# Patient Record
Sex: Female | Born: 1990 | Race: Black or African American | Hispanic: No | Marital: Single | Smoking: Current some day smoker
Health system: Southern US, Community
[De-identification: ages and names within clinical notes are randomized; demographics above are authoritative.]

## PROBLEM LIST (undated history)

## (undated) ENCOUNTER — Inpatient Hospital Stay (HOSPITAL_COMMUNITY): Payer: Self-pay

## (undated) DIAGNOSIS — F419 Anxiety disorder, unspecified: Secondary | ICD-10-CM

---

## 2018-06-28 ENCOUNTER — Encounter (HOSPITAL_COMMUNITY): Payer: Self-pay | Admitting: Emergency Medicine

## 2018-06-28 ENCOUNTER — Emergency Department (HOSPITAL_COMMUNITY)
Admission: EM | Admit: 2018-06-28 | Discharge: 2018-06-28 | Disposition: A | Discharge status: Home / Self Care | Payer: Self-pay | Attending: Emergency Medicine | Admitting: Emergency Medicine

## 2018-06-28 DIAGNOSIS — B9789 Other viral agents as the cause of diseases classified elsewhere: Secondary | ICD-10-CM | POA: Insufficient documentation

## 2018-06-28 DIAGNOSIS — J029 Acute pharyngitis, unspecified: Secondary | ICD-10-CM | POA: Insufficient documentation

## 2018-06-28 HISTORY — DX: Anxiety disorder, unspecified: F41.9

## 2018-06-28 LAB — GROUP A STREP BY PCR: Group A Strep by PCR: NOT DETECTED

## 2018-06-28 MED ORDER — IBUPROFEN 400 MG PO TABS
600.0000 mg | ORAL_TABLET | Freq: Once | ORAL | Status: AC
  Administered 2018-06-28: 600 mg via ORAL
  Filled 2018-06-28: qty 1

## 2018-06-28 NOTE — Discharge Instructions (Addendum)
Your strep test is negative. Please read instructions below.  You can take tylenol or ibuprofen as needed for sore throat.  Drink plenty of water.  Use saline nasal spray for congestion. Establish primary care.  Return to the ER for inability to swallow liquids, difficulty breathing, or new or worsening symptoms.

## 2018-06-28 NOTE — ED Provider Notes (Signed)
[facility] EMERGENCY DEPARTMENT Provider Note   CSN: [id number] Arrival date & time: 06/28/18  0705     History   Chief Complaint Chief Complaint  Patient presents with  . Sore Throat  . URI    HPI Destiny Abbott is a 27 y.o. female past medical history of anxiety, presenting the emergency department with gradual onset of sore throat that began late last night. She states she had cold symptoms that began before Thanksgiving which had resolved with the exception of a mild lingering cough.  These new symptoms started last night while at work.  States her throat felt somewhat scratchy and therefore she tried treating with hot tea without much relief.  She states her throat is painful to yawn, talk, swallow.  She is associated rhinorrhea and bilateral ear pain.  No fever, difficulty breathing or swallowing.  The history is provided by the patient.    Past Medical History:  Diagnosis Date  . Anxiety     There are no active problems to display for this patient.   History reviewed. No pertinent surgical history.   OB History   None      Home Medications    Prior to Admission medications   Not on File    Family History No family history on file.  Social History Social History   Tobacco Use  . Smoking status: Not on file  Substance Use Topics  . Alcohol use: Not on file  . Drug use: Not on file     Allergies   Patient has no known allergies.   Review of Systems Review of Systems  Constitutional: Negative for fever.  HENT: Positive for ear pain, rhinorrhea, sore throat and voice change (Hoarse). Negative for ear discharge and trouble swallowing.   Respiratory: Negative for shortness of breath.      Physical Exam Updated Vital Signs BP 118/85   Pulse 72   Temp 98.7 F (37.1 C)   Resp 18   LMP 06/28/2018   SpO2 100%   Physical Exam  Constitutional: She appears well-developed and well-nourished.  Non-toxic appearance. She does not  appear ill.  HENT:  Head: Normocephalic and atraumatic.  Right Ear: Tympanic membrane and ear canal normal. There is tenderness.  Left Ear: Tympanic membrane and ear canal normal. There is tenderness.  Mouth/Throat: Uvula is midline. No trismus in the jaw. No uvula swelling. Posterior oropharyngeal erythema (very mild) present. No posterior oropharyngeal edema. No tonsillar exudate.  Eyes: Conjunctivae are normal.  Neck: Normal range of motion. Neck supple.  Cardiovascular: Normal rate, regular rhythm and normal heart sounds.  Pulmonary/Chest: Effort normal and breath sounds normal. No respiratory distress.  Lymphadenopathy:    She has no cervical adenopathy.  Psychiatric: She has a normal mood and affect. Her behavior is normal.  Nursing note and vitals reviewed.    ED Treatments / Results  Labs (all labs ordered are listed, but only abnormal results are displayed) Labs Reviewed  GROUP A STREP BY PCR    EKG None  Radiology No results found.  Procedures Procedures (including critical care time)  Medications Ordered in ED Medications  ibuprofen (ADVIL,MOTRIN) tablet 600 mg (600 mg Oral Given 06/28/18 0735)     Initial Impression / Assessment and Plan / ED Course  I have reviewed the triage vital signs and the nursing notes.  Pertinent labs & imaging results that were available during my care of the patient were reviewed by me and considered in my medical  decision making (see chart for details).     Patients symptoms are consistent with pharyngitis, likely viral etiology. Strep test neg. Patient is afebrile, tolerating secretions. Presentation not concerning for PTA or infxn spread to soft tissue. No difficulty breathing or swallowing. No trismus or uvula deviation. Discussed that antibiotics are not indicated for viral infections. Pt will be discharged with symptomatic treatment. Strict return precautions given. Pt is hemodynamically stable and in NAD prior to  discharge.  Discussed results, findings, treatment and follow up. Patient advised of return precautions. Patient verbalized understanding and agreed with plan.  Final Clinical Impressions(s) / ED Diagnoses   Final diagnoses:  Viral pharyngitis    ED Discharge Orders    None       Green Quincy, [place] N, PA-C 06/28/18 [id number]    Pricilla LovelessGoldston, Scott, MD 07/02/18 1511

## 2018-06-28 NOTE — ED Triage Notes (Signed)
Pt here fort eval of 1-2 weeks of sore throat, bilateral ear pain, runny nose congestion and a cough.

## 2018-06-28 NOTE — ED Notes (Signed)
Declined W/C at D/C and was escorted to lobby by RN. 

## 2018-10-01 ENCOUNTER — Inpatient Hospital Stay (HOSPITAL_COMMUNITY): Payer: Medicaid Other

## 2018-10-01 ENCOUNTER — Other Ambulatory Visit: Payer: Self-pay

## 2018-10-01 ENCOUNTER — Encounter (HOSPITAL_COMMUNITY): Payer: Self-pay | Admitting: *Deleted

## 2018-10-01 ENCOUNTER — Inpatient Hospital Stay (HOSPITAL_COMMUNITY)
Admission: AD | Admit: 2018-10-01 | Discharge: 2018-10-01 | Disposition: A | Discharge status: Home / Self Care | Payer: Medicaid Other | Attending: Obstetrics and Gynecology | Admitting: Obstetrics and Gynecology

## 2018-10-01 DIAGNOSIS — O99341 Other mental disorders complicating pregnancy, first trimester: Secondary | ICD-10-CM | POA: Diagnosis not present

## 2018-10-01 DIAGNOSIS — F419 Anxiety disorder, unspecified: Secondary | ICD-10-CM | POA: Diagnosis not present

## 2018-10-01 DIAGNOSIS — O200 Threatened abortion: Secondary | ICD-10-CM | POA: Insufficient documentation

## 2018-10-01 DIAGNOSIS — N939 Abnormal uterine and vaginal bleeding, unspecified: Secondary | ICD-10-CM

## 2018-10-01 DIAGNOSIS — F17200 Nicotine dependence, unspecified, uncomplicated: Secondary | ICD-10-CM | POA: Diagnosis not present

## 2018-10-01 DIAGNOSIS — Z3A09 9 weeks gestation of pregnancy: Secondary | ICD-10-CM | POA: Diagnosis not present

## 2018-10-01 DIAGNOSIS — O99331 Smoking (tobacco) complicating pregnancy, first trimester: Secondary | ICD-10-CM | POA: Insufficient documentation

## 2018-10-01 DIAGNOSIS — O09291 Supervision of pregnancy with other poor reproductive or obstetric history, first trimester: Secondary | ICD-10-CM

## 2018-10-01 LAB — WET PREP, GENITAL
CLUE CELLS WET PREP: NONE SEEN
Sperm: NONE SEEN
TRICH WET PREP: NONE SEEN

## 2018-10-01 LAB — CBC WITH DIFFERENTIAL/PLATELET
Abs Immature Granulocytes: 0.05 10*3/uL (ref 0.00–0.07)
Basophils Absolute: 0 10*3/uL (ref 0.0–0.1)
Basophils Relative: 0 %
Eosinophils Absolute: 0.3 10*3/uL (ref 0.0–0.5)
Eosinophils Relative: 3 %
HCT: 33.4 % — ABNORMAL LOW (ref 36.0–46.0)
HEMOGLOBIN: 10.8 g/dL — AB (ref 12.0–15.0)
Immature Granulocytes: 1 %
LYMPHS ABS: 2.7 10*3/uL (ref 0.7–4.0)
Lymphocytes Relative: 28 %
MCH: 29 pg (ref 26.0–34.0)
MCHC: 32.3 g/dL (ref 30.0–36.0)
MCV: 89.8 fL (ref 80.0–100.0)
Monocytes Absolute: 0.9 10*3/uL (ref 0.1–1.0)
Monocytes Relative: 9 %
Neutro Abs: 5.5 10*3/uL (ref 1.7–7.7)
Neutrophils Relative %: 59 %
Platelets: 305 10*3/uL (ref 150–400)
RBC: 3.72 MIL/uL — AB (ref 3.87–5.11)
RDW: 13.8 % (ref 11.5–15.5)
WBC: 9.5 10*3/uL (ref 4.0–10.5)
nRBC: 0 % (ref 0.0–0.2)

## 2018-10-01 LAB — URINALYSIS, ROUTINE W REFLEX MICROSCOPIC
BILIRUBIN URINE: NEGATIVE
Glucose, UA: NEGATIVE mg/dL
Hgb urine dipstick: NEGATIVE
Ketones, ur: NEGATIVE mg/dL
Leukocytes,Ua: NEGATIVE
Nitrite: NEGATIVE
Protein, ur: NEGATIVE mg/dL
Specific Gravity, Urine: 1.024 (ref 1.005–1.030)
pH: 6 (ref 5.0–8.0)

## 2018-10-01 LAB — COMPREHENSIVE METABOLIC PANEL
ALBUMIN: 3.5 g/dL (ref 3.5–5.0)
ALT: 16 U/L (ref 0–44)
AST: 20 U/L (ref 15–41)
Alkaline Phosphatase: 69 U/L (ref 38–126)
Anion gap: 7 (ref 5–15)
BILIRUBIN TOTAL: 0.4 mg/dL (ref 0.3–1.2)
BUN: 6 mg/dL (ref 6–20)
CO2: 23 mmol/L (ref 22–32)
Calcium: 9.5 mg/dL (ref 8.9–10.3)
Chloride: 104 mmol/L (ref 98–111)
Creatinine, Ser: 0.67 mg/dL (ref 0.44–1.00)
GFR calc Af Amer: >60 mL/min (ref >60)
GFR calc non Af Amer: >60 mL/min (ref >60)
GLUCOSE: 87 mg/dL (ref 70–99)
Potassium: 3.5 mmol/L (ref 3.5–5.1)
SODIUM: 134 mmol/L — AB (ref 135–145)
Total Protein: 7 g/dL (ref 6.5–8.1)

## 2018-10-01 LAB — HCG, QUANTITATIVE, PREGNANCY: hCG, Beta Chain, Quant, S: 90936 m[IU]/mL — ABNORMAL HIGH (ref <5)

## 2018-10-01 MED ORDER — CONCEPT OB 130-92.4-1 MG PO CAPS: 1.0000 | ORAL_CAPSULE | Freq: Every day | ORAL | 2 refills | Status: AC

## 2018-10-01 NOTE — Discharge Instructions (Signed)
Threatened Miscarriage    A threatened miscarriage is when you have bleeding from your vagina during the first 20 weeks of pregnancy but the pregnancy does not end. Your doctor will do tests to make sure you are still pregnant. The cause of the bleeding may not be known. This condition does not mean your pregnancy will end, but it does increase the risk that it will end (complete miscarriage).  Follow these instructions at home:  · Get plenty of rest.  · If you have bleeding in your vagina, do not have sex or use tampons.  · Do not douche.  · Do not smoke or use drugs.  · Do not drink alcohol.  · Avoid caffeine.  · Keep all follow-up prenatal visits as told by your doctor. This is important.  Contact a doctor if:  · You have light bleeding from your vagina.  · You have belly pain or cramping.  · You have a fever.  Get help right away if:  · You have heavy bleeding from your vagina.  · You have clots of blood coming from your vagina.  · You pass tissue from your vagina.  · You have a gush of fluid from your vagina.  · You are leaking fluid from your vagina.  · You have very bad pain or cramps in your low back or belly.  · You have fever, chills, and very bad belly pain.  Summary  · A threatened miscarriage is when you have bleeding from your vagina during the first 20 weeks of pregnancy but the pregnancy does not end.  · This condition does not mean your pregnancy will end, but it does increase the risk that it will end (complete miscarriage).  · Get plenty of rest. If you have bleeding in your vagina, do not have sex or use tampons.  · Keep all follow-up prenatal visits as told by your doctor. This is important.  This information is not intended to replace advice given to you by your health care provider. Make sure you discuss any questions you have with your health care provider.  Document Released: 06/22/2008 Document Revised: 10/06/2016 Document Reviewed: 10/06/2016  Elsevier Interactive Patient Education © 2019  [employer]

## 2018-10-01 NOTE — MAU Provider Note (Addendum)
History     CSN: [id number]  Arrival date and time: 10/01/18 1514   First Provider Initiated Contact with Patient 10/01/18 1623      Chief Complaint  Patient presents with  . Abdominal Pain  . Vaginal Bleeding   HPI Destiny Abbott is a 28 y.o. G3P0020 at [redacted]w[redacted]d who presents to MAU with chief complaint of sharp abdominal pain and heavy vaginal bleeding. These are new problems, onset at work this morning. Patient reports she has had episodes of vaginal spotting and was told she had a "threatened miscarriage". However, after lunch she experienced a "slice of pain across my abdomen" closely followed by multiple episodes of heavy vaginal bleeding. Her symptoms resolved without intervention prior to her arrival in MAU.  OB History    Gravida  3   Para      Term      Preterm      AB  2   Living        SAB  2   TAB      Ectopic      Multiple      Live Births              Past Medical History:  Diagnosis Date  . Anxiety     History reviewed. No pertinent surgical history.  History reviewed. No pertinent family history.  Social History   Tobacco Use  . Smoking status: Current Some Day Smoker  . Smokeless tobacco: Never Used  . Tobacco comment: pt states she is using quit now to stop smoking   Substance Use Topics  . Alcohol use: Not Currently  . Drug use: Not Currently    Allergies: No Known Allergies  Medications Prior to Admission  Medication Sig Dispense Refill Last Dose  . famotidine (PEPCID) 10 MG tablet Take 10 mg by mouth 2 (two) times daily.   10/01/2018 at Unknown time    Review of Systems  Constitutional: Negative for chills, fatigue and fever.  Respiratory: Negative for shortness of breath.   Gastrointestinal: Negative for abdominal pain.  Genitourinary: Negative for difficulty urinating, flank pain, vaginal bleeding, vaginal discharge and vaginal pain.  Musculoskeletal: Negative for back pain.  Neurological: Negative for headaches.  All  other systems reviewed and are negative.  Physical Exam   Blood pressure 116/62, pulse 75, temperature 98.4 F (36.9 C), temperature source Oral, resp. rate 17, height 5\' 3"  (1.6 m), weight 90.4 kg, last menstrual period 07/28/2018, SpO2 100 %.  Physical Exam  Nursing note and vitals reviewed. Constitutional: She is oriented to person, place, and time. She appears well-developed and well-nourished.  Cardiovascular: Normal rate.  Respiratory: Effort normal.  GI: Soft. She exhibits no distension. There is no abdominal tenderness. There is no rebound and no guarding.  Genitourinary:    Vaginal discharge present.     Genitourinary Comments: Thick Bussie vaginal discharge on SSE Cervix soft   Neurological: She is alert and oriented to person, place, and time.  Skin: Skin is warm and dry.  Psychiatric: She has a normal mood and affect. Her behavior is normal. Judgment and thought content normal.   No bleeding noted on SSE US Ob Less Than 14 Weeks With Ob Transvaginal  Result Date: 10/01/2018 CLINICAL DATA:  28 year old female with vaginal bleeding in the 1st trimester of pregnancy. Estimated gestational age by LMP 9 weeks 2 days. EXAM: OBSTETRIC <14 WK [place] AND TRANSVAGINAL OB US TECHNIQUE: Both transabdominal and transvaginal ultrasound examinations were performed for complete evaluation  of the gestation as well as the maternal uterus, adnexal regions, and pelvic cul-de-sac. Transvaginal technique was performed to assess early pregnancy. COMPARISON:  None. FINDINGS: Intrauterine gestational sac: Single Yolk sac:  Visible Embryo:  Visible Cardiac Activity: Detected Heart Rate: 169 bpm CRL:  25.4 mm   9 w   1 d                  [place] EDC: 05/05/2019 Subchorionic hemorrhage:  None visualized. Maternal uterus/adnexae: The right ovary appears diminutive and normal on image 74. However, the left ovary is enlarged and heterogeneous with multiple complex hyperechoic areas ranging from 27-35 millimeters  diameter. See images 61 and 63. Superimposed 3.4 centimeter simple appearing left ovarian cyst. On Doppler none of these ovarian lesions seem to have internal vascular elements. There is background vascularity in the intervening left ovarian parenchyma. There is a small volume of simple appearing free fluid adjacent to the left ovary. IMPRESSION: 1. Single living IUP demonstrated. No subchorionic hemorrhage identified. 2. Enlarged and heterogeneous left ovary with multiple combined simple cystic and complex but seemingly nonvascular lesions ranging from 27-35 mm diameter. The appearance is not typical for hemorrhagic cysts, endometrioma, or dermoid. 3. The right ovary appears diminutive and normal. Electronically Signed   By: Odessa Fleming M.D.   On: 10/01/2018 18:23    MAU Course  Procedures: sterile speculum exam  Report given to T. Shawnie Pons, MD, who assumes care of patient at this time  Clayton Bibles, [place] 10/01/18  5:45 PM    Assessment and Plan  Threatened abortion - Plan: Discharge patient  [redacted] weeks gestation of pregnancy - Plan: US OB LESS THAN 14 WEEKS WITH OB TRANSVAGINAL, US OB LESS THAN 14 WEEKS WITH OB TRANSVAGINAL  History of miscarriage, currently pregnant, first trimester - Plan: US OB LESS THAN 14 WEEKS WITH OB TRANSVAGINAL, US OB LESS THAN 14 WEEKS WITH OB TRANSVAGINAL  Episode of heavy vaginal bleeding - Plan: US OB LESS THAN 14 WEEKS WITH OB TRANSVAGINAL, US OB LESS THAN 14 WEEKS WITH OB TRANSVAGINAL   Ovarian Cyst - left--needs follow-up D/C home with vaginal bleeding precautions.  Follow-up Information    Alm Bustard, MD Follow up.   Specialty:  Obstetrics and Gynecology Contact information: [address] [place] [address] [phone]          Allergies as of 10/01/2018   No Known Allergies     Medication List    TAKE these medications   Concept OB 130-92.4-1 MG Caps Take 1 capsule by mouth daily.   famotidine 10 MG tablet Commonly known as:   PEPCID Take 10 mg by mouth 2 (two) times daily.

## 2018-10-01 NOTE — MAU Note (Signed)
Left work this morning early, due to abd pain and bleeding.  Is ~ 9 wks preg.  Had a miscarriage last Aug, and one before that.  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

## 2018-10-02 LAB — ABO/RH: ABO/RH(D): O POS

## 2019-12-25 IMAGING — US OBSTETRIC <14 WK US AND TRANSVAGINAL OB US
1 series · 15 of 28 positions shown · non-contrast
Comparison: None.

CLINICAL DATA: 27-year-old female with vaginal bleeding in the 1st
trimester of pregnancy. Estimated gestational age by LMP 9 weeks 2
days.

EXAM:
OBSTETRIC <14 WK US AND TRANSVAGINAL OB US
TECHNIQUE: Both transabdominal and transvaginal ultrasound examinations were
performed for complete evaluation of the gestation as well as the
maternal uterus, adnexal regions, and pelvic cul-de-sac.
Transvaginal technique was performed to assess early pregnancy.

[Series 1: obstetric <14 wk us and transvaginal ob us · 71 acquisitions, 15 frames shown]
[im 1/71]
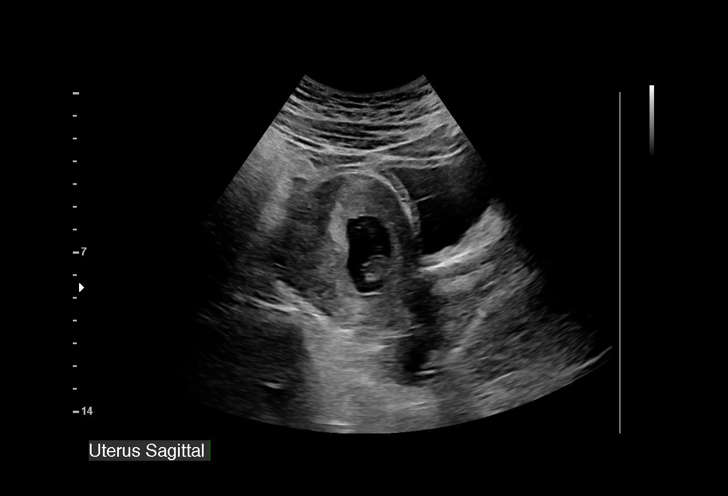
[im 6/71]
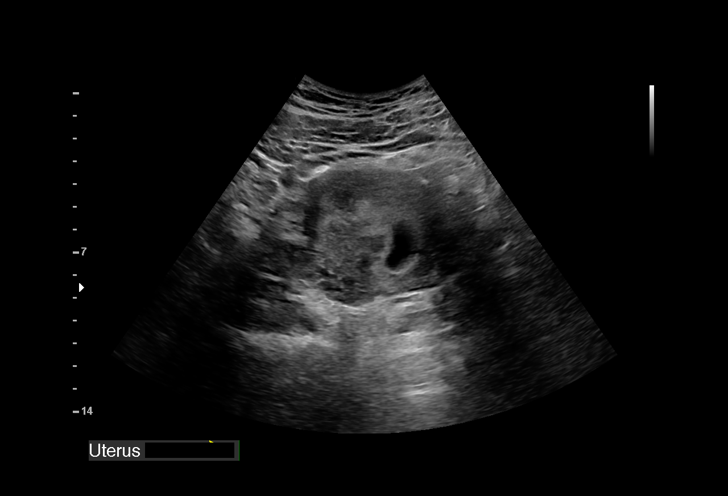
[im 11/71]
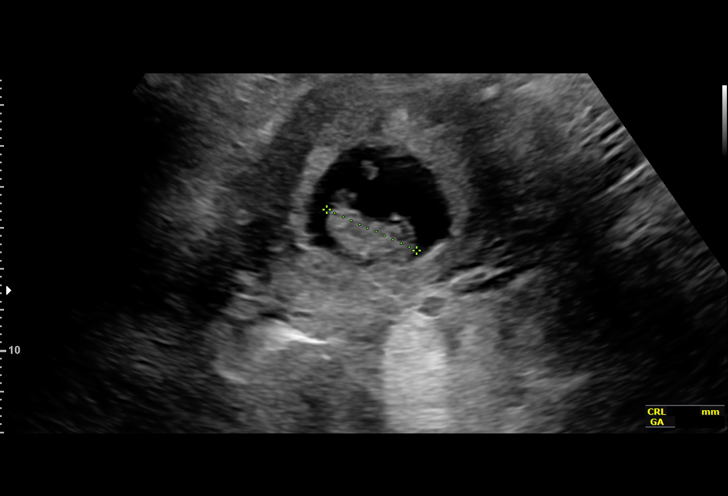
[im 16/71]
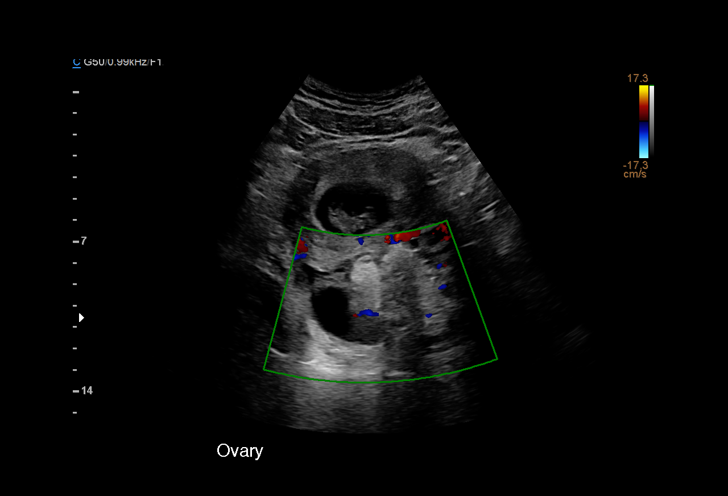
[im 21/71]
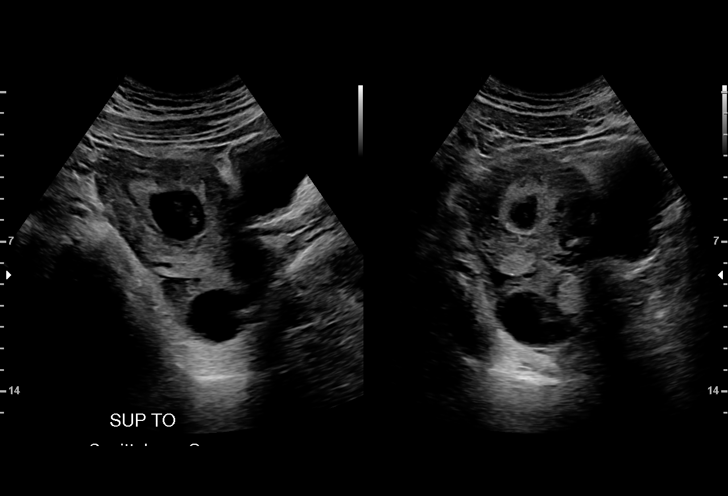
[im 26/71]
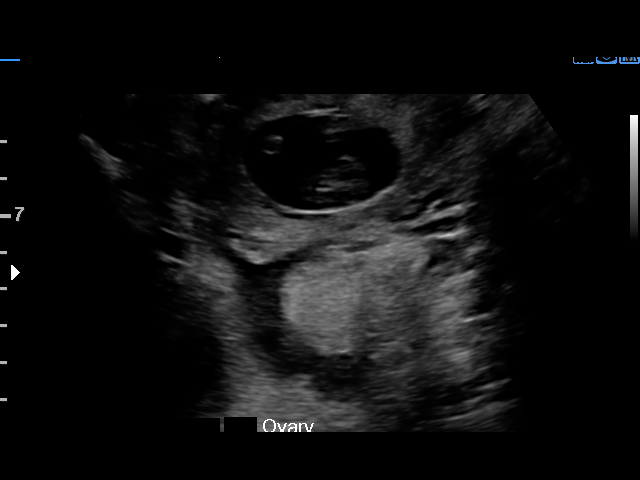
[im 32/71]
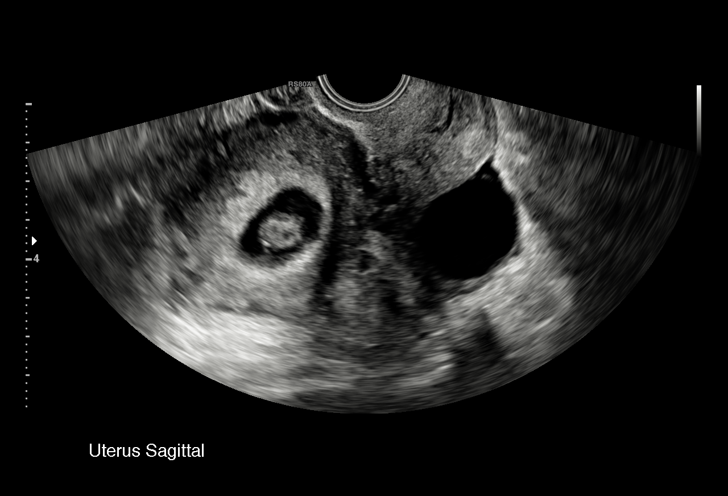
[im 37/71]
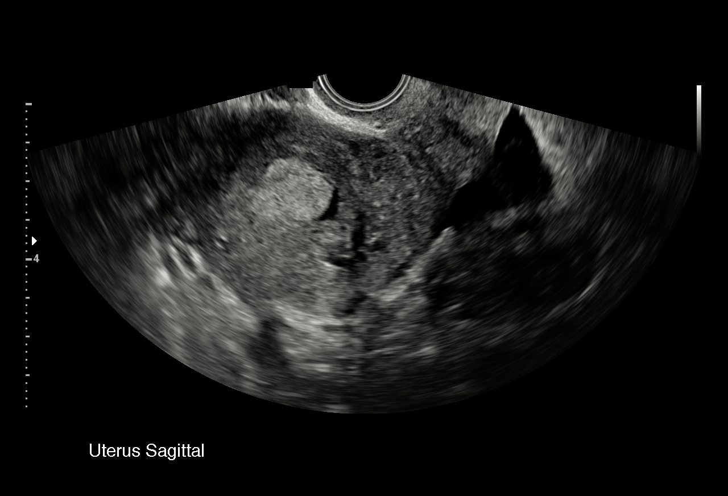
[im 39/71]
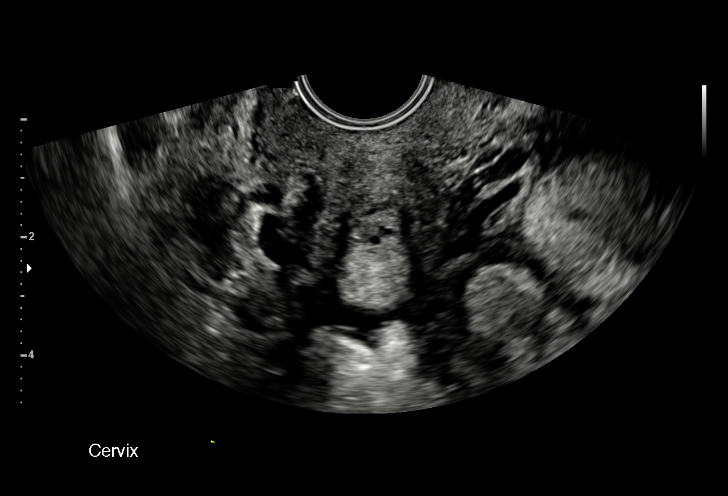
[im 45/71]
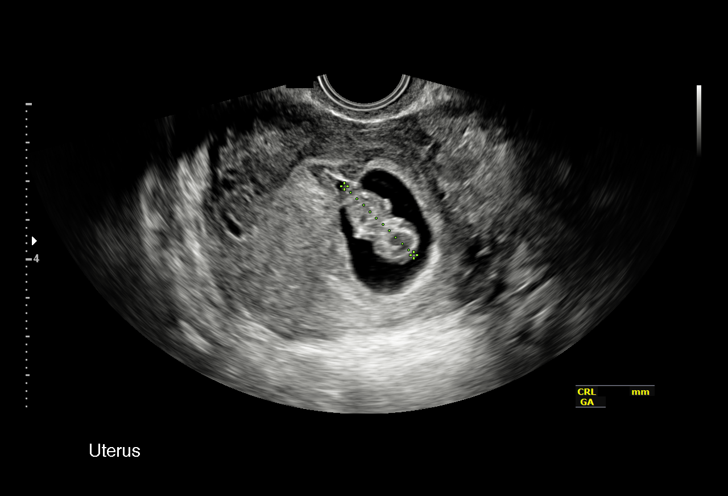
[im 50/71]
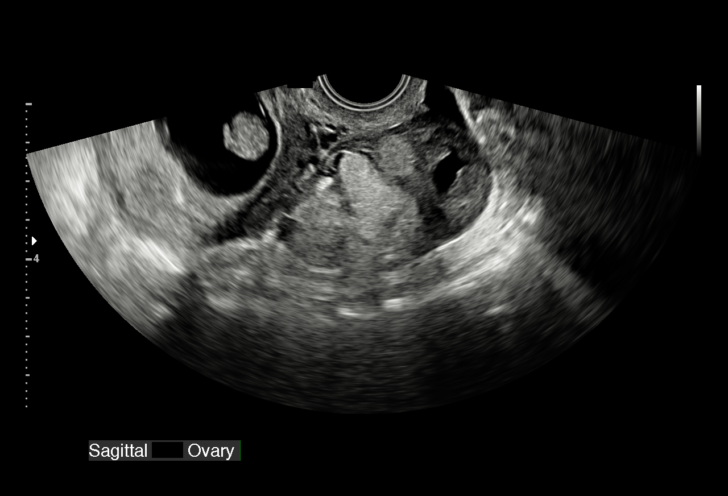
[im 55/71]
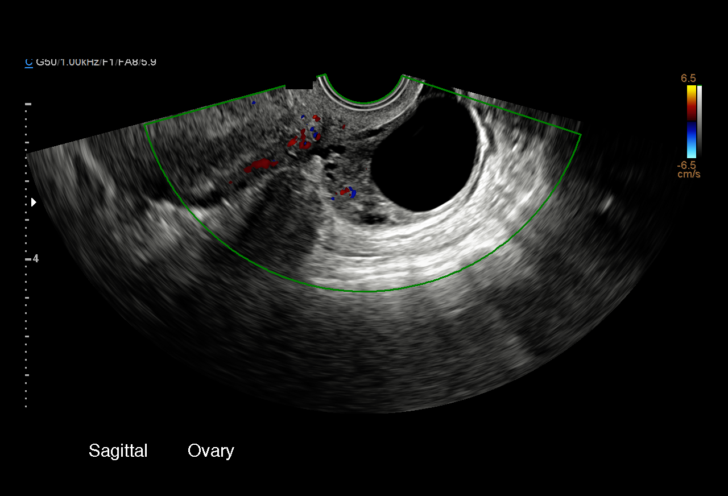
[im 60/71]
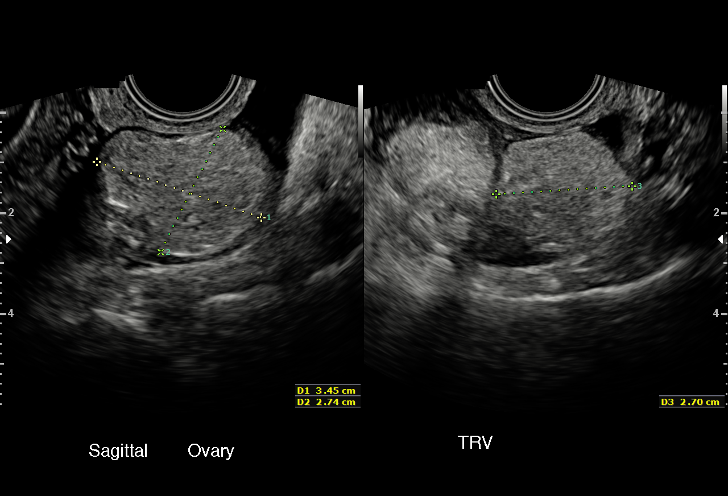
[im 65/71]
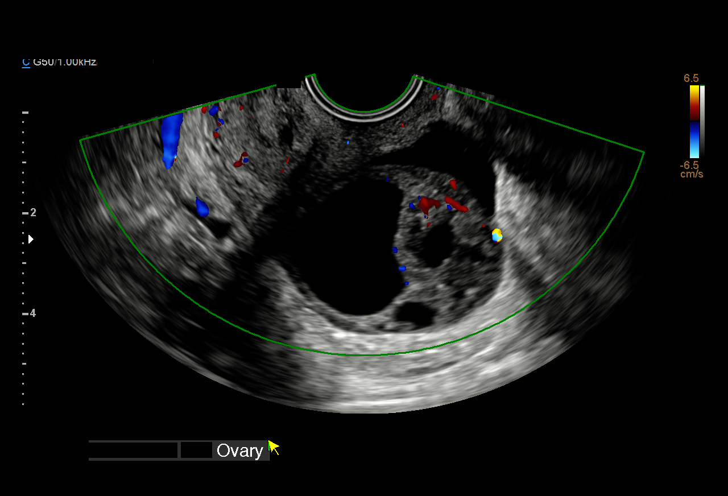
[im 71/71]
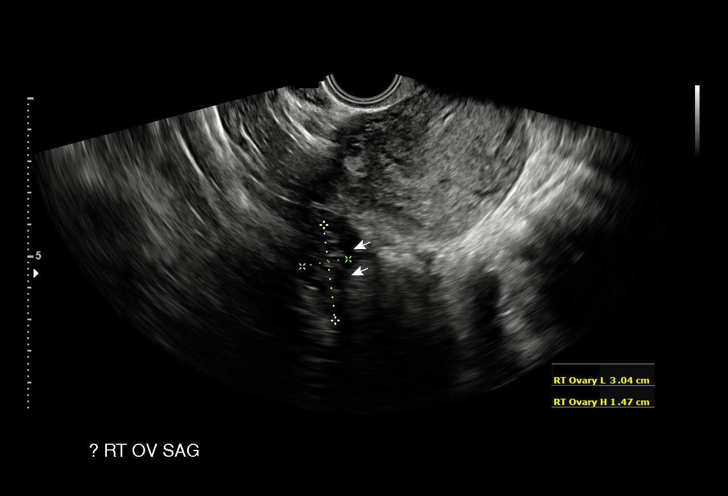

[15 of 28 positions shown; findings below may reference images not displayed]

FINDINGS: Intrauterine gestational sac: Single

Yolk sac:  Visible

Embryo:  Visible

Cardiac Activity: Detected

Heart Rate: 169 bpm

CRL:  25.4 mm   9 w   1 d                  US EDC: 05/05/2019

Subchorionic hemorrhage:  None visualized.

Maternal uterus/adnexae:

The right ovary appears diminutive and normal on image 74.

However, the left ovary is enlarged and heterogeneous with multiple
complex hyperechoic areas ranging from 27-35 millimeters diameter.
See images 61 and 63. Superimposed 3.4 centimeter simple appearing
left ovarian cyst. On Doppler none of these ovarian lesions seem to
have internal vascular elements. There is background vascularity in
the intervening left ovarian parenchyma. There is a small volume of
simple appearing free fluid adjacent to the left ovary.
IMPRESSION: 1. Single living IUP demonstrated. No subchorionic hemorrhage
identified.
2. Enlarged and heterogeneous left ovary with multiple combined
simple cystic and complex but seemingly nonvascular lesions ranging
from 27-35 mm diameter. The appearance is not typical for
hemorrhagic cysts, endometrioma, or dermoid.
3. The right ovary appears diminutive and normal.
